# Patient Record
Sex: Female | Born: 1950 | Race: Black or African American | Hispanic: No | Smoking: Never smoker
Health system: Southern US, Community
[De-identification: ages and names within clinical notes are randomized; demographics above are authoritative.]

## PROBLEM LIST (undated history)

## (undated) DIAGNOSIS — D649 Anemia, unspecified: Secondary | ICD-10-CM

## (undated) DIAGNOSIS — E039 Hypothyroidism, unspecified: Secondary | ICD-10-CM

## (undated) DIAGNOSIS — I10 Essential (primary) hypertension: Secondary | ICD-10-CM

## (undated) DIAGNOSIS — D219 Benign neoplasm of connective and other soft tissue, unspecified: Secondary | ICD-10-CM

## (undated) HISTORY — PX: DILATION AND CURETTAGE OF UTERUS: SHX78

## (undated) HISTORY — DX: Benign neoplasm of connective and other soft tissue, unspecified: D21.9

## (undated) HISTORY — PX: HYSTEROSCOPY: SHX211

## (undated) HISTORY — DX: Essential (primary) hypertension: I10

## (undated) HISTORY — DX: Hypothyroidism, unspecified: E03.9

## (undated) HISTORY — DX: Anemia, unspecified: D64.9

---

## 2004-11-09 ENCOUNTER — Ambulatory Visit: Payer: Self-pay | Admitting: Gastroenterology

## 2004-12-23 ENCOUNTER — Ambulatory Visit: Payer: Self-pay

## 2006-01-20 ENCOUNTER — Ambulatory Visit: Payer: Self-pay

## 2007-01-24 ENCOUNTER — Ambulatory Visit: Payer: Self-pay

## 2008-01-31 ENCOUNTER — Ambulatory Visit: Payer: Self-pay

## 2009-02-04 ENCOUNTER — Ambulatory Visit: Payer: Self-pay

## 2010-02-05 ENCOUNTER — Ambulatory Visit: Payer: Self-pay

## 2011-02-15 ENCOUNTER — Ambulatory Visit: Payer: Self-pay

## 2012-03-06 ENCOUNTER — Ambulatory Visit: Payer: Self-pay

## 2013-03-15 ENCOUNTER — Ambulatory Visit: Payer: Self-pay

## 2013-10-24 ENCOUNTER — Ambulatory Visit: Payer: Self-pay | Admitting: Obstetrics and Gynecology

## 2013-10-24 LAB — COMPREHENSIVE METABOLIC PANEL
Anion Gap: 3 — ABNORMAL LOW (ref 7–16)
Bilirubin,Total: 0.4 mg/dL (ref 0.2–1.0)
Chloride: 105 mmol/L (ref 98–107)
EGFR (Non-African Amer.): >60
Glucose: 85 mg/dL (ref 65–99)
Osmolality: 277 (ref 275–301)
Potassium: 3.7 mmol/L (ref 3.5–5.1)
SGOT(AST): 25 U/L (ref 15–37)

## 2013-10-24 LAB — CBC
HGB: 12 g/dL (ref 12.0–16.0)
MCH: 32.6 pg (ref 26.0–34.0)
MCHC: 34.2 g/dL (ref 32.0–36.0)
MCV: 95 fL (ref 80–100)
Platelet: 261 10*3/uL (ref 150–440)

## 2013-10-30 ENCOUNTER — Ambulatory Visit: Payer: Self-pay | Admitting: Obstetrics and Gynecology

## 2013-11-01 LAB — PATHOLOGY REPORT

## 2014-04-10 ENCOUNTER — Ambulatory Visit: Payer: Self-pay

## 2014-09-01 DIAGNOSIS — E039 Hypothyroidism, unspecified: Secondary | ICD-10-CM | POA: Insufficient documentation

## 2014-09-01 DIAGNOSIS — E7800 Pure hypercholesterolemia, unspecified: Secondary | ICD-10-CM | POA: Insufficient documentation

## 2014-09-01 DIAGNOSIS — I10 Essential (primary) hypertension: Secondary | ICD-10-CM | POA: Insufficient documentation

## 2015-03-21 NOTE — Op Note (Signed)
PATIENT NAME:  Bianca Arnold, Bianca Arnold MR#:  [id number] DATE OF BIRTH:  [birth year]  DATE OF PROCEDURE:  10/30/2013  PREOPERATIVE DIAGNOSES:  1.  Menopause.  2.  Significantly thickened endometrial stripe.  3.  Cervical stenosis, unable to sample endometrium in office.  POSTOPERATIVE DIAGNOSES: 1.  Menopause.  2.  Significantly thickened endometrial stripe.  3.  Cervical stenosis, unable to sample endometrium in office.   PROCEDURES:  1.  Dilation and curettage.  2.  Hysteroscopy.   ANESTHESIA: General.   SURGEON: Prentice Docker, M.D.   ESTIMATED BLOOD LOSS: 20 mL.  OPERATIVE FLUIDS: 500 mL crystalloid.   COMPLICATIONS: None.   FINDINGS: Polypoid lesions in uterus.   SPECIMENS: Endometrial curettings.   CONDITION AT END OF PROCEDURE: Stable.   PROCEDURE IN DETAIL: The patient was taken to the operating room where general anesthesia was administered and found to be adequate. She was placed in the dorsal supine high lithotomy position in candy cane stirrups and prepped and draped in the usual sterile fashion. After a timeout was called, a red rubber catheter was used to do an in and out catheterization for return of 100 mL of clear urine. A sterile speculum was placed in the vagina and a single-tooth tenaculum was used to grasp the anterior lip of the cervix. The uterus was sounded to a depth of approximately 8 cm and found to be retroverted. The cervix was then dilated gently in a serial fashion using Hegar dilators to a dilatation approximately 8 mm. The hysteroscope was then gently introduced through the cervix with the above-noted findings appreciated upon entry. The hysteroscope was removed and a gentle curettage was undertaken to get a global sampling of the uterus. The hysteroscope was then reintroduced to verify global sampling as well as no damage done to the uterus through the curetting process. The hysteroscope was then removed as well as the single-tooth tenaculum. The tenaculum  site was found to be hemostatic after application of silver nitrate. All instrumentation was removed from the vagina. This concluded the surgery.   The patient tolerated the procedure well. Sponge, lap, and needle counts were correct x 2. She was wearing pneumatic compression stockings for VTE prophylaxis throughout the entire procedure. She was awakened in the operating room and taken to the recovery area in stable condition.    ____________________________ Will Bonnet, MD sdj:aw D: 10/30/2013 10:13:16 ET T: 10/30/2013 10:28:13 ET JOB#: [id number]  cc: Will Bonnet, MD, <Dictator> Will Bonnet MD ELECTRONICALLY SIGNED 10/30/2013 19:10

## 2015-03-26 ENCOUNTER — Other Ambulatory Visit: Payer: Self-pay

## 2015-03-26 DIAGNOSIS — Z1231 Encounter for screening mammogram for malignant neoplasm of breast: Secondary | ICD-10-CM

## 2015-04-14 ENCOUNTER — Ambulatory Visit
Admission: RE | Admit: 2015-04-14 | Discharge: 2015-04-14 | Disposition: A | Discharge status: Home / Self Care | Payer: 59 | Source: Ambulatory Visit | Attending: Unknown Physician Specialty | Admitting: Unknown Physician Specialty

## 2015-04-14 DIAGNOSIS — Z1231 Encounter for screening mammogram for malignant neoplasm of breast: Secondary | ICD-10-CM | POA: Diagnosis not present

## 2016-04-12 ENCOUNTER — Other Ambulatory Visit: Payer: Self-pay | Admitting: Unknown Physician Specialty

## 2016-04-12 DIAGNOSIS — Z1231 Encounter for screening mammogram for malignant neoplasm of breast: Secondary | ICD-10-CM

## 2016-04-13 LAB — HM PAP SMEAR: HM PAP: NEGATIVE

## 2016-04-22 ENCOUNTER — Other Ambulatory Visit: Payer: Self-pay | Admitting: Unknown Physician Specialty

## 2016-04-22 ENCOUNTER — Ambulatory Visit
Admission: RE | Admit: 2016-04-22 | Discharge: 2016-04-22 | Disposition: A | Discharge status: Home / Self Care | Payer: 59 | Source: Ambulatory Visit | Attending: Unknown Physician Specialty | Admitting: Unknown Physician Specialty

## 2016-04-22 DIAGNOSIS — Z1231 Encounter for screening mammogram for malignant neoplasm of breast: Secondary | ICD-10-CM | POA: Diagnosis present

## 2017-04-21 ENCOUNTER — Encounter: Payer: Self-pay | Admitting: Obstetrics and Gynecology

## 2017-04-21 ENCOUNTER — Ambulatory Visit (INDEPENDENT_AMBULATORY_CARE_PROVIDER_SITE_OTHER): Payer: 59 | Admitting: Obstetrics and Gynecology

## 2017-04-21 VITALS — BP 124/70 | Ht 67.0 in | Wt 141.0 lb

## 2017-04-21 DIAGNOSIS — Z1339 Encounter for screening examination for other mental health and behavioral disorders: Secondary | ICD-10-CM

## 2017-04-21 DIAGNOSIS — Z01419 Encounter for gynecological examination (general) (routine) without abnormal findings: Secondary | ICD-10-CM | POA: Insufficient documentation

## 2017-04-21 DIAGNOSIS — Z124 Encounter for screening for malignant neoplasm of cervix: Secondary | ICD-10-CM

## 2017-04-21 DIAGNOSIS — E2839 Other primary ovarian failure: Secondary | ICD-10-CM

## 2017-04-21 DIAGNOSIS — Z1231 Encounter for screening mammogram for malignant neoplasm of breast: Secondary | ICD-10-CM

## 2017-04-21 DIAGNOSIS — Z1389 Encounter for screening for other disorder: Secondary | ICD-10-CM

## 2017-04-21 DIAGNOSIS — Z13820 Encounter for screening for osteoporosis: Secondary | ICD-10-CM

## 2017-04-21 DIAGNOSIS — Z9189 Other specified personal risk factors, not elsewhere classified: Secondary | ICD-10-CM

## 2017-04-21 DIAGNOSIS — Z1239 Encounter for other screening for malignant neoplasm of breast: Secondary | ICD-10-CM

## 2017-04-21 DIAGNOSIS — Z1331 Encounter for screening for depression: Secondary | ICD-10-CM

## 2017-04-21 NOTE — Progress Notes (Signed)
Routine Annual Gynecology Examination   PCP: Bianca Ruths, Bianca Arnold  Chief Complaint  Patient presents with  . Annual Exam    History of Present Illness: Patient is a 66 y.o. G0P0000 presents for annual exam. The patient has no complaints today.   Menopausal bleeding: denies  Menopausal symptoms: denies  Breast symptoms: denies  Last pap smear: 1 years ago.  Result Normal  Last mammogram: 1 years ago.  Result Normal  Past Medical History:  Diagnosis Date  . Anemia   . Fibroids   . Hypertension   . Hypothyroid     Past Surgical History:  Procedure Laterality Date  . DILATION AND CURETTAGE OF UTERUS    . HYSTEROSCOPY      Medications:   Medication Sig Start Date End Date Taking? Authorizing Provider  levothyroxine (SYNTHROID, LEVOTHROID) 75 MCG tablet TAKE 1 TABLET BY MOUTH  DAILY 11/03/16  Yes Provider, Historical, Bianca Arnold  lisinopril (PRINIVIL,ZESTRIL) 10 MG tablet TAKE 1 TABLET BY MOUTH  DAILY 11/03/16  Yes Provider, Historical, Bianca Arnold    Allergies  Allergen Reactions  . Penicillin V Potassium Other (See Comments)    Severe reaction    Gynecologic History:  No LMP recorded. Patient is postmenopausal. Contraception: n/a, menopause  Obstetric History: G0P0000  Social History   Social History  . Marital status: Widowed    Spouse name: N/A  . Number of children: N/A  . Years of education: N/A   Occupational History  . Not on file.   Social History Main Topics  . Smoking status: Never Smoker  . Smokeless tobacco: Never Used  . Alcohol use No  . Drug use: No  . Sexual activity: Not Currently    Birth control/ protection: Post-menopausal   Other Topics Concern  . Not on file   Social History Narrative  . No narrative on file    Family History  Problem Relation Age of Onset  . Hypertension Mother   . Breast cancer Neg Hx     Review of Systems  Constitutional: Negative.   HENT: Negative.   Eyes: Negative.   Respiratory: Negative.     Cardiovascular: Negative.   Gastrointestinal: Negative.   Genitourinary: Negative.   Musculoskeletal: Negative.   Skin: Negative.   Neurological: Negative.   Psychiatric/Behavioral: Negative.      Physical Exam Vitals: BP 124/70   Ht 5\' 7"  (1.702 m)   Wt 141 lb (64 kg)   BMI 22.08 kg/m   Physical Exam  Constitutional: She is oriented to person, place, and time and well-developed, well-nourished, and in no distress. No distress.  HENT:  Head: Normocephalic and atraumatic.  Eyes: Conjunctivae are normal. Left eye exhibits no discharge. No scleral icterus.  Neck: Normal range of motion. Neck supple.  Cardiovascular: Normal rate and regular rhythm.  Exam reveals no gallop and no friction rub.   No murmur heard. Pulmonary/Chest: Effort normal and breath sounds normal. No respiratory distress. She has no wheezes. She has no rales. Right breast exhibits no inverted nipple, no mass, no nipple discharge, no skin change and no tenderness. Left breast exhibits no inverted nipple, no mass, no nipple discharge, no skin change and no tenderness. Breasts are symmetrical.  Abdominal: Soft. Bowel sounds are normal. She exhibits no distension and no mass. There is no tenderness. There is no rebound and no guarding.  Genitourinary: Vagina normal, uterus normal, cervix normal, right adnexa normal, left adnexa normal and vulva normal.  Musculoskeletal: Normal range of motion. She exhibits no  edema.  Lymphadenopathy:    She has no cervical adenopathy.  Neurological: She is alert and oriented to person, place, and time. No cranial nerve deficit.  Skin: Skin is warm and dry. No rash noted.  Psychiatric: Mood, affect and judgment normal.    Female chaperone present for pelvic and breast  portions of the physical exam  Results: AUDIT Questionnaire (screen for alcoholism): 0 PHQ-9: 0   Assessment and Plan:  66 y.o. G0P0000 here for annual gynecologic examination.   Plan: Problem List Items  Addressed This Visit    Women's annual routine gynecological examination - Primary   Relevant Orders   MM DIGITAL SCREENING BILATERAL   Pap IG (Image Guided)    Other Visit Diagnoses    Screening for depression       Screening for alcohol problem       Pap smear for cervical cancer screening       Relevant Orders   Pap IG (Image Guided)   Screening for breast cancer       Relevant Orders   MM DIGITAL SCREENING BILATERAL   Screening for osteoporosis         Screening: -- Blood pressure: per PCP -- Colonoscopy - not due -- Mammogram - due. Ordered. Patient understands to call [facility] to schedule.  -- Weight screening: normal -- Depression screening negative (PHQ-9) -- Nutrition: normal -- cholesterol screening: n/a -- osteoporosis screening: Needs DEXA. Attempting to order.  -- tobacco screening: not using -- alcohol screening: AUDIT questionnaire indicates low-risk usage. -- family history of breast cancer screening: done. not at high risk. -- no evidence of domestic violence or intimate partner violence. -- STD screening: gonorrhea/chlamydia NAAT not collected. -- pap smear collected.  Prentice Docker, Bianca Arnold 04/21/2017 2:44 PM

## 2017-04-26 LAB — PAP IG (IMAGE GUIDED): PAP Smear Comment: 0

## 2017-04-28 ENCOUNTER — Encounter: Payer: Self-pay | Admitting: Obstetrics and Gynecology

## 2017-05-05 NOTE — Addendum Note (Signed)
Addended by: Prentice Docker D on: 05/05/2017 04:44 PM   Modules accepted: Orders

## 2017-05-09 ENCOUNTER — Telehealth: Payer: Self-pay | Admitting: Obstetrics and Gynecology

## 2017-05-09 NOTE — Telephone Encounter (Signed)
Patient is aware of DEXA scan at [facility] on Monday, 06/27/17 @ 1pm and to avoid wearing metal on her clothing.

## 2017-05-26 ENCOUNTER — Ambulatory Visit
Admission: RE | Admit: 2017-05-26 | Discharge: 2017-05-26 | Disposition: A | Discharge status: Home / Self Care | Payer: 59 | Source: Ambulatory Visit | Attending: Obstetrics and Gynecology | Admitting: Obstetrics and Gynecology

## 2017-05-26 DIAGNOSIS — Z1231 Encounter for screening mammogram for malignant neoplasm of breast: Secondary | ICD-10-CM | POA: Insufficient documentation

## 2017-05-26 DIAGNOSIS — Z01419 Encounter for gynecological examination (general) (routine) without abnormal findings: Secondary | ICD-10-CM

## 2017-05-26 DIAGNOSIS — Z1239 Encounter for other screening for malignant neoplasm of breast: Secondary | ICD-10-CM

## 2017-06-27 ENCOUNTER — Ambulatory Visit
Admission: RE | Admit: 2017-06-27 | Discharge: 2017-06-27 | Disposition: A | Discharge status: Home / Self Care | Payer: 59 | Source: Ambulatory Visit | Attending: Obstetrics and Gynecology | Admitting: Obstetrics and Gynecology

## 2017-06-27 DIAGNOSIS — Z9189 Other specified personal risk factors, not elsewhere classified: Secondary | ICD-10-CM | POA: Insufficient documentation

## 2017-06-27 DIAGNOSIS — M85852 Other specified disorders of bone density and structure, left thigh: Secondary | ICD-10-CM | POA: Insufficient documentation

## 2017-06-27 DIAGNOSIS — E2839 Other primary ovarian failure: Secondary | ICD-10-CM | POA: Diagnosis present

## 2017-08-17 ENCOUNTER — Encounter: Payer: Self-pay | Admitting: Obstetrics and Gynecology

## 2017-10-06 DIAGNOSIS — M85852 Other specified disorders of bone density and structure, left thigh: Secondary | ICD-10-CM | POA: Insufficient documentation

## 2018-05-11 ENCOUNTER — Other Ambulatory Visit: Payer: Self-pay | Admitting: Obstetrics and Gynecology

## 2018-05-11 DIAGNOSIS — Z1231 Encounter for screening mammogram for malignant neoplasm of breast: Secondary | ICD-10-CM

## 2018-05-17 ENCOUNTER — Ambulatory Visit (INDEPENDENT_AMBULATORY_CARE_PROVIDER_SITE_OTHER): Payer: Medicare HMO | Admitting: Obstetrics and Gynecology

## 2018-05-17 ENCOUNTER — Encounter: Payer: Self-pay | Admitting: Obstetrics and Gynecology

## 2018-05-17 VITALS — BP 118/78 | HR 81 | Ht 67.0 in | Wt 143.0 lb

## 2018-05-17 DIAGNOSIS — Z01419 Encounter for gynecological examination (general) (routine) without abnormal findings: Secondary | ICD-10-CM

## 2018-05-17 DIAGNOSIS — Z1339 Encounter for screening examination for other mental health and behavioral disorders: Secondary | ICD-10-CM

## 2018-05-17 DIAGNOSIS — Z1331 Encounter for screening for depression: Secondary | ICD-10-CM | POA: Diagnosis not present

## 2018-05-17 DIAGNOSIS — M85852 Other specified disorders of bone density and structure, left thigh: Secondary | ICD-10-CM

## 2018-05-17 DIAGNOSIS — Z01411 Encounter for gynecological examination (general) (routine) with abnormal findings: Secondary | ICD-10-CM | POA: Diagnosis not present

## 2018-05-17 NOTE — Progress Notes (Signed)
Routine Annual Gynecology Examination   PCP: Kirk Ruths, MD  Chief Complaint  Patient presents with  . Gynecologic Exam    History of Present Illness: Patient is a 67 y.o. G0P0000 presents for annual exam. The patient has no complaints today.   Menopausal bleeding: denies  Menopausal symptoms: denies  Breast symptoms: denies  Last pap smear: 2 years ago.  Result Normal  Last mammogram: 1 years ago.  Result Normal  Past Medical History:  Diagnosis Date  . Anemia   . Fibroids   . Hypertension   . Hypothyroid    Past Surgical History:  Procedure Laterality Date  . DILATION AND CURETTAGE OF UTERUS    . HYSTEROSCOPY      Prior to Admission medications   Medication Sig Start Date End Date Taking? Authorizing Provider  Ascorbic Acid (VITAMIN C PO) Take by mouth.   Yes [provider]  CALCIUM PO Take by mouth.   Yes [provider]  cholecalciferol (VITAMIN D) 1000 units tablet Take 1,000 Units by mouth daily.   Yes [provider]  levothyroxine (SYNTHROID, LEVOTHROID) 75 MCG tablet TAKE 1 TABLET BY MOUTH  DAILY 11/03/16  Yes [provider]  lisinopril (PRINIVIL,ZESTRIL) 10 MG tablet TAKE 1 TABLET BY MOUTH  DAILY 11/03/16  Yes [provider]  MAGNESIUM PO Take by mouth.   Yes [provider]    Allergies  Allergen Reactions  . Penicillin V Potassium Other (See Comments)    Severe reaction   Obstetric History: G0P0000  Social History   Socioeconomic History  . Marital status: Widowed    Spouse name: Not on file  . Number of children: Not on file  . Years of education: Not on file  . Highest education level: Not on file  Occupational History  . Not on file  Social Needs  . Financial resource strain: Not on file  . Food insecurity:    Worry: Not on file    Inability: Not on file  . Transportation needs:    Medical: Not on file    Non-medical: Not on file  Tobacco Use  . Smoking status:  Never Smoker  . Smokeless tobacco: Never Used  Substance and Sexual Activity  . Alcohol use: No  . Drug use: No  . Sexual activity: Not Currently    Birth control/protection: Post-menopausal  Lifestyle  . Physical activity:    Days per week: Not on file    Minutes per session: Not on file  . Stress: Not on file  Relationships  . Social connections:    Talks on phone: Not on file    Gets together: Not on file    Attends religious service: Not on file    Active member of club or organization: Not on file    Attends meetings of clubs or organizations: Not on file    Relationship status: Not on file  . Intimate partner violence:    Fear of current or ex partner: Not on file    Emotionally abused: Not on file    Physically abused: Not on file    Forced sexual activity: Not on file  Other Topics Concern  . Not on file  Social History Narrative  . Not on file    Family History  Problem Relation Age of Onset  . Hypertension Mother   . Cancer Brother   . Breast cancer Neg Hx     Review of Systems  Constitutional: Negative.   HENT:  Negative.   Eyes: Negative.   Respiratory: Negative.   Cardiovascular: Negative.   Gastrointestinal: Negative.   Genitourinary: Negative.   Musculoskeletal: Negative.   Skin: Negative.   Neurological: Negative.   Psychiatric/Behavioral: Negative.     Physical Exam Vitals: BP 118/78 (BP Location: Left Arm, Patient Position: Sitting, Cuff Size: Normal)   Pulse 81   Ht 5\' 7"  (1.702 m)   Wt 143 lb (64.9 kg)   SpO2 99%   BMI 22.40 kg/m   Physical Exam  Constitutional: She is oriented to person, place, and time. She appears well-developed and well-nourished. No distress.  Genitourinary: Uterus normal. Pelvic exam was performed with patient supine. There is no rash, tenderness, lesion or injury on the right labia. There is no rash, tenderness, lesion or injury on the left labia. No erythema, tenderness or bleeding in the vagina. No signs of  injury around the vagina. No vaginal discharge found. Right adnexum does not display mass, does not display tenderness and does not display fullness. Left adnexum does not display mass, does not display tenderness and does not display fullness. Cervix does not exhibit motion tenderness, lesion, discharge or polyp.   Uterus is mobile and anteverted. Uterus is not enlarged, tender or exhibiting a mass.  HENT:  Head: Normocephalic and atraumatic.  Eyes: EOM are normal. No scleral icterus.  Neck: Normal range of motion. Neck supple. No thyromegaly present.  Cardiovascular: Normal rate and regular rhythm. Exam reveals no gallop and no friction rub.  No murmur heard. Pulmonary/Chest: Effort normal and breath sounds normal. No respiratory distress. She has no wheezes. She has no rales. Right breast exhibits no inverted nipple, no mass, no nipple discharge, no skin change and no tenderness. Left breast exhibits no inverted nipple, no mass, no nipple discharge, no skin change and no tenderness.  Abdominal: Soft. Bowel sounds are normal. She exhibits no distension and no mass. There is no tenderness. There is no rebound and no guarding.  Musculoskeletal: Normal range of motion. She exhibits no edema or tenderness.  Lymphadenopathy:    She has no cervical adenopathy.       Right: No inguinal adenopathy present.       Left: No inguinal adenopathy present.  Neurological: She is alert and oriented to person, place, and time. No cranial nerve deficit.  Skin: Skin is warm and dry. No rash noted. No erythema.  Psychiatric: She has a normal mood and affect. Her behavior is normal. Judgment normal.     Female chaperone present for pelvic and breast  portions of the physical exam  Results: AUDIT Questionnaire (screen for alcoholism): 1 PHQ-9: 0   Assessment and Plan:  67 y.o. G0P0000 female here for routine annual gynecologic examination  Plan: Problem List Items Addressed This Visit       Musculoskeletal and Integument   Osteopenia of left hip     Other   Women's annual routine gynecological examination - Primary    Other Visit Diagnoses    Screening for depression       Screening for alcoholism          Screening: -- Blood pressure screen managed by PCP -- Colonoscopy - per PCP -- Mammogram - due - already scheduled at [facility] on 7/1//19 -- Weight screening: normal -- Depression screening negative (PHQ-9) -- Nutrition: normal -- cholesterol screening: per PCP -- osteoporosis screening: Osteopenia. Reviewed results from last year.   Discussed continued taking Vitamin D/Calcium. Repeat DEXA next year. -- tobacco screening: not using --  alcohol screening: AUDIT questionnaire indicates low-risk usage. -- family history of breast cancer screening: done. not at high risk. -- no evidence of domestic violence or intimate partner violence. -- STD screening: gonorrhea/chlamydia NAAT not collected per patient request. -- pap smear not collected per ASCCP guidelines -- HPV vaccination series: not eligilbe   Prentice Docker, MD 05/17/2018 5:26 PM

## 2018-05-29 ENCOUNTER — Ambulatory Visit
Admission: RE | Admit: 2018-05-29 | Discharge: 2018-05-29 | Disposition: A | Discharge status: Home / Self Care | Payer: Medicare HMO | Source: Ambulatory Visit | Attending: Obstetrics and Gynecology | Admitting: Obstetrics and Gynecology

## 2018-05-29 DIAGNOSIS — Z1231 Encounter for screening mammogram for malignant neoplasm of breast: Secondary | ICD-10-CM | POA: Diagnosis present

## 2019-04-30 ENCOUNTER — Other Ambulatory Visit: Payer: Self-pay | Admitting: Obstetrics and Gynecology

## 2019-04-30 DIAGNOSIS — Z1231 Encounter for screening mammogram for malignant neoplasm of breast: Secondary | ICD-10-CM

## 2019-05-31 ENCOUNTER — Ambulatory Visit (INDEPENDENT_AMBULATORY_CARE_PROVIDER_SITE_OTHER): Payer: Medicare HMO | Admitting: Obstetrics and Gynecology

## 2019-05-31 ENCOUNTER — Encounter: Payer: Self-pay | Admitting: Obstetrics and Gynecology

## 2019-05-31 ENCOUNTER — Telehealth: Payer: Self-pay | Admitting: Obstetrics and Gynecology

## 2019-05-31 ENCOUNTER — Other Ambulatory Visit: Payer: Self-pay

## 2019-05-31 VITALS — BP 122/74 | Ht 67.0 in | Wt 135.0 lb

## 2019-05-31 DIAGNOSIS — E2839 Other primary ovarian failure: Secondary | ICD-10-CM

## 2019-05-31 DIAGNOSIS — Z1331 Encounter for screening for depression: Secondary | ICD-10-CM

## 2019-05-31 DIAGNOSIS — Z124 Encounter for screening for malignant neoplasm of cervix: Secondary | ICD-10-CM | POA: Diagnosis not present

## 2019-05-31 DIAGNOSIS — Z1339 Encounter for screening examination for other mental health and behavioral disorders: Secondary | ICD-10-CM | POA: Diagnosis not present

## 2019-05-31 DIAGNOSIS — M85852 Other specified disorders of bone density and structure, left thigh: Secondary | ICD-10-CM | POA: Diagnosis not present

## 2019-05-31 NOTE — Telephone Encounter (Signed)
Pt aware of Dexa scan on 07/11/2019 at 2:20pm. No calcium supplements the morning of.

## 2019-05-31 NOTE — Progress Notes (Addendum)
Routine Annual Gynecology Examination   PCP: Kirk Ruths, MD  Chief Complaint  Patient presents with  . Follow-up    Osteopenia    History of Present Illness: Patient is a 68 y.o. G0P0000 presents for a follow up exam for osteopenia. The patient has no complaints today.   Menopausal bleeding: denies  Menopausal symptoms: denies  Breast symptoms: denies  Last pap smear: 2 years ago.  Result Normal  Last mammogram: 1 years ago.  Result Normal   Last Colonoscopy: end of 2019.  Small polyp and internal hemorrhoid. Follow up 10 years.   Last DEXA 2 years ago. Osteopenia. T score -2.0, FRAX Major fractore 3.6% of 10 years, hip 0.4% within next 10 years.   Past Medical History:  Diagnosis Date  . Anemia   . Fibroids   . Hypertension   . Hypothyroid     Past Surgical History:  Procedure Laterality Date  . DILATION AND CURETTAGE OF UTERUS    . HYSTEROSCOPY      Prior to Admission medications   Medication Sig Start Date End Date Taking? Authorizing Provider  Ascorbic Acid (VITAMIN C PO) Take by mouth.   Yes [provider]  CALCIUM PO Take by mouth.   Yes [provider]  cholecalciferol (VITAMIN D) 1000 units tablet Take 1,000 Units by mouth daily.   Yes [provider]  levothyroxine (SYNTHROID, LEVOTHROID) 75 MCG tablet TAKE 1 TABLET BY MOUTH  DAILY 11/03/16  Yes [provider]  lisinopril (PRINIVIL,ZESTRIL) 10 MG tablet TAKE 1 TABLET BY MOUTH  DAILY 11/03/16  Yes [provider]  MAGNESIUM PO Take by mouth.   Yes [provider]    Allergies  Allergen Reactions  . Penicillin V Potassium Other (See Comments)    Severe reaction    Obstetric History: G0P0000  Social History   Socioeconomic History  . Marital status: Widowed    Spouse name: Not on file  . Number of children: Not on file  . Years of education: Not on file  . Highest education level: Not on file  Occupational History  . Not on  file  Social Needs  . Financial resource strain: Not on file  . Food insecurity    Worry: Not on file    Inability: Not on file  . Transportation needs    Medical: Not on file    Non-medical: Not on file  Tobacco Use  . Smoking status: Never Smoker  . Smokeless tobacco: Never Used  Substance and Sexual Activity  . Alcohol use: No  . Drug use: No  . Sexual activity: Not Currently    Birth control/protection: Post-menopausal  Lifestyle  . Physical activity    Days per week: Not on file    Minutes per session: Not on file  . Stress: Not on file  Relationships  . Social Herbalist on phone: Not on file    Gets together: Not on file    Attends religious service: Not on file    Active member of club or organization: Not on file    Attends meetings of clubs or organizations: Not on file    Relationship status: Not on file  . Intimate partner violence    Fear of current or ex partner: Not on file    Emotionally abused: Not on file    Physically abused: Not on file    Forced sexual activity: Not on file  Other Topics Concern  . Not  on file  Social History Narrative  . Not on file    Family History  Problem Relation Age of Onset  . Hypertension Mother   . Cancer Brother   . Breast cancer Neg Hx     Review of Systems  Constitutional: Negative.   HENT: Negative.   Eyes: Negative.   Respiratory: Negative.   Cardiovascular: Negative.   Gastrointestinal: Negative.   Genitourinary: Negative.   Musculoskeletal: Negative.   Skin: Negative.   Neurological: Negative.   Psychiatric/Behavioral: Negative.      Physical Exam Vitals: BP 122/74   Ht 5\' 7"  (1.702 m)   Wt 135 lb (61.2 kg)   BMI 21.14 kg/m   Physical Exam Constitutional:      General: She is not in acute distress.    Appearance: Normal appearance. She is well-developed.  Genitourinary:     Pelvic exam was performed with patient supine.     Vulva, urethra, bladder and uterus normal.     No  inguinal adenopathy present in the right or left side.    No signs of injury in the vagina.     No vaginal discharge, erythema, tenderness or bleeding.     No cervical motion tenderness, discharge, lesion or polyp.     Uterus is mobile.     Uterus is not enlarged or tender.     No uterine mass detected.    Uterus is anteverted.     No right or left adnexal mass present.     Right adnexa not tender or full.     Left adnexa not tender or full.  HENT:     Head: Normocephalic and atraumatic.  Eyes:     General: No scleral icterus.    Conjunctiva/sclera: Conjunctivae normal.  Neck:     Musculoskeletal: Normal range of motion and neck supple.     Thyroid: No thyromegaly.  Cardiovascular:     Rate and Rhythm: Normal rate and regular rhythm.     Heart sounds: No murmur. No friction rub. No gallop.   Pulmonary:     Effort: Pulmonary effort is normal. No respiratory distress.     Breath sounds: Normal breath sounds. No wheezing or rales.  Chest:     Breasts:        Right: No inverted nipple, mass, nipple discharge, skin change or tenderness.        Left: No inverted nipple, mass, nipple discharge, skin change or tenderness.  Abdominal:     General: Bowel sounds are normal. There is no distension.     Palpations: Abdomen is soft. There is no mass.     Tenderness: There is no abdominal tenderness. There is no guarding or rebound.  Musculoskeletal: Normal range of motion.        General: No swelling or tenderness.  Lymphadenopathy:     Cervical: No cervical adenopathy.     Lower Body: No right inguinal adenopathy. No left inguinal adenopathy.  Neurological:     General: No focal deficit present.     Mental Status: She is alert and oriented to person, place, and time.     Cranial Nerves: No cranial nerve deficit.  Skin:    General: Skin is warm and dry.     Findings: No erythema or rash.  Psychiatric:        Mood and Affect: Mood normal.        Behavior: Behavior normal.         Judgment: Judgment normal.  Female chaperone present for pelvic and breast  portions of the physical exam  Results: AUDIT Questionnaire (screen for alcoholism): 2 PHQ-9: 0  Assessment and Plan:  68 y.o. G0P0000 female here for follow up of osteopenia.    Plan: Problem List Items Addressed This Visit      Musculoskeletal and Integument   Osteopenia of left hip - Primary   Relevant Orders   DG Bone Density    Other Visit Diagnoses    Screening for depression       Screening for alcoholism       Estrogen deficiency       Relevant Orders   DG Bone Density      Screening: -- Blood pressure screen managed by PCP -- Colonoscopy - per PCP -- Mammogram - due - already scheduled at [facility] on 06/13/2019 -- Weight screening: normal -- Depression screening negative (PHQ-9) -- Nutrition: normal -- cholesterol screening: per PCP -- osteoporosis screening: Osteopenia 2 years ago.  Due for follow up -- tobacco screening: not using -- alcohol screening: AUDIT questionnaire indicates low-risk usage. -- family history of breast cancer screening: done. not at high risk. -- no evidence of domestic violence or intimate partner violence. -- STD screening: gonorrhea/chlamydia NAAT not collected per patient request. -- pap smear not collected per ASCCP guidelines -- HPV vaccination series: not eligilbe  Osteopenia: Given that her last DEXA was 2 years ago, she is due again. Will repeat DEXA and FRAX to see her risk and possible need for treatment.   Prentice Docker, MD 05/31/2019 11:47 AM

## 2019-06-13 ENCOUNTER — Other Ambulatory Visit: Payer: Self-pay

## 2019-06-13 ENCOUNTER — Ambulatory Visit
Admission: RE | Admit: 2019-06-13 | Discharge: 2019-06-13 | Disposition: A | Discharge status: Home / Self Care | Payer: Medicare HMO | Source: Ambulatory Visit | Attending: Obstetrics and Gynecology | Admitting: Obstetrics and Gynecology

## 2019-06-13 DIAGNOSIS — Z1231 Encounter for screening mammogram for malignant neoplasm of breast: Secondary | ICD-10-CM | POA: Diagnosis present

## 2019-07-03 NOTE — Addendum Note (Signed)
Addended by: Prentice Docker D on: 07/03/2019 12:32 PM   Modules accepted: Orders, Level of Service

## 2019-07-11 ENCOUNTER — Other Ambulatory Visit: Payer: Self-pay

## 2019-07-11 ENCOUNTER — Ambulatory Visit
Admission: RE | Admit: 2019-07-11 | Discharge: 2019-07-11 | Disposition: A | Discharge status: Home / Self Care | Payer: Medicare HMO | Source: Ambulatory Visit | Attending: Obstetrics and Gynecology | Admitting: Obstetrics and Gynecology

## 2019-07-11 DIAGNOSIS — M85852 Other specified disorders of bone density and structure, left thigh: Secondary | ICD-10-CM | POA: Diagnosis present

## 2019-07-11 DIAGNOSIS — E2839 Other primary ovarian failure: Secondary | ICD-10-CM | POA: Insufficient documentation

## 2019-09-21 ENCOUNTER — Encounter: Payer: Self-pay | Admitting: Obstetrics and Gynecology

## 2020-05-05 ENCOUNTER — Other Ambulatory Visit: Payer: Self-pay | Admitting: Internal Medicine

## 2020-05-05 DIAGNOSIS — Z1231 Encounter for screening mammogram for malignant neoplasm of breast: Secondary | ICD-10-CM

## 2020-06-13 ENCOUNTER — Ambulatory Visit
Admission: RE | Admit: 2020-06-13 | Discharge: 2020-06-13 | Disposition: A | Discharge status: Home / Self Care | Payer: Medicare HMO | Source: Ambulatory Visit | Attending: Internal Medicine | Admitting: Internal Medicine

## 2020-06-13 DIAGNOSIS — Z1231 Encounter for screening mammogram for malignant neoplasm of breast: Secondary | ICD-10-CM

## 2020-06-20 ENCOUNTER — Other Ambulatory Visit: Payer: Self-pay

## 2020-06-20 ENCOUNTER — Encounter: Payer: Self-pay | Admitting: Obstetrics and Gynecology

## 2020-06-20 ENCOUNTER — Ambulatory Visit (INDEPENDENT_AMBULATORY_CARE_PROVIDER_SITE_OTHER): Payer: Medicare HMO | Admitting: Obstetrics and Gynecology

## 2020-06-20 VITALS — BP 122/76 | Ht 66.0 in | Wt 119.0 lb

## 2020-06-20 DIAGNOSIS — Z1339 Encounter for screening examination for other mental health and behavioral disorders: Secondary | ICD-10-CM

## 2020-06-20 DIAGNOSIS — Z01419 Encounter for gynecological examination (general) (routine) without abnormal findings: Secondary | ICD-10-CM

## 2020-06-20 DIAGNOSIS — Z1331 Encounter for screening for depression: Secondary | ICD-10-CM | POA: Diagnosis not present

## 2020-06-20 NOTE — Progress Notes (Signed)
Routine Annual Gynecology Examination   PCP: Kirk Ruths, MD  Chief Complaint  Patient presents with  . Annual Exam   History of Present Illness: Patient is a 69 y.o. G0P0000 presents for annual exam. The patient has no complaints today.   Menopausal bleeding: denies  Menopausal symptoms: denies  Breast symptoms: denies  Last pap smear: 3 years ago.  Result Normal  Last mammogram: 05/2020 years ago.  Result Normal  Past Medical History:  Diagnosis Date  . Anemia   . Fibroids   . Hypertension   . Hypothyroid     Past Surgical History:  Procedure Laterality Date  . DILATION AND CURETTAGE OF UTERUS    . HYSTEROSCOPY      Prior to Admission medications   Medication Sig Start Date End Date Taking? Authorizing Provider  Ascorbic Acid (VITAMIN C PO) Take by mouth.   Yes [provider]  CALCIUM PO Take by mouth.   Yes [provider]  cholecalciferol (VITAMIN D) 1000 units tablet Take 1,000 Units by mouth daily.   Yes [provider]  fluticasone [facility] ALLERGY RELIEF) 50 MCG/ACT nasal spray  09/17/19  Yes [provider]  levothyroxine (SYNTHROID, LEVOTHROID) 75 MCG tablet TAKE 1 TABLET BY MOUTH  DAILY 11/03/16  Yes [provider]  lisinopril (PRINIVIL,ZESTRIL) 10 MG tablet TAKE 1 TABLET BY MOUTH  DAILY 11/03/16  Yes [provider]  MAGNESIUM PO Take by mouth.   Yes [provider]    Allergies  Allergen Reactions  . Penicillin V Potassium Other (See Comments)    Severe reaction    Obstetric History: G0P0000  Social History   Socioeconomic History  . Marital status: Widowed    Spouse name: Not on file  . Number of children: Not on file  . Years of education: Not on file  . Highest education level: Not on file  Occupational History  . Not on file  Tobacco Use  . Smoking status: Never Smoker  . Smokeless tobacco: Never Used  Vaping Use  . Vaping Use: Never used  Substance and  Sexual Activity  . Alcohol use: No  . Drug use: No  . Sexual activity: Not Currently    Birth control/protection: Post-menopausal  Other Topics Concern  . Not on file  Social History Narrative  . Not on file   Social Determinants of Health   Financial Resource Strain:   . Difficulty of Paying Living Expenses:   Food Insecurity:   . Worried About Charity fundraiser in the Last Year:   . Arboriculturist in the Last Year:   Transportation Needs:   . Film/video editor (Medical):   Marland Kitchen Lack of Transportation (Non-Medical):   Physical Activity:   . Days of Exercise per Week:   . Minutes of Exercise per Session:   Stress:   . Feeling of Stress :   Social Connections:   . Frequency of Communication with Friends and Family:   . Frequency of Social Gatherings with Friends and Family:   . Attends Religious Services:   . Active Member of Clubs or Organizations:   . Attends Archivist Meetings:   Marland Kitchen Marital Status:   Intimate Partner Violence:   . Fear of Current or Ex-Partner:   . Emotionally Abused:   Marland Kitchen Physically Abused:   . Sexually Abused:     Family History  Problem Relation Age of Onset  . Hypertension Mother   . Cancer Brother   .  Breast cancer Neg Hx     Review of Systems  Constitutional: Negative.   HENT: Negative.   Eyes: Negative.   Respiratory: Negative.   Cardiovascular: Negative.   Gastrointestinal: Negative.   Genitourinary: Negative.   Musculoskeletal: Negative.   Skin: Negative.   Neurological: Negative.   Psychiatric/Behavioral: Negative.      Physical Exam Vitals: BP 122/76   Ht 5\' 6"  (1.676 m)   Wt 119 lb (54 kg)   BMI 19.21 kg/m   Physical Exam Constitutional:      General: She is not in acute distress.    Appearance: Normal appearance. She is well-developed.  Genitourinary:     Pelvic exam was performed with patient in the lithotomy position.     Vulva, urethra, bladder and uterus normal.     No inguinal adenopathy  present in the right or left side.    No signs of injury in the vagina.     No vaginal discharge, erythema, tenderness or bleeding.     No cervical motion tenderness, discharge, lesion or polyp.     Uterus is mobile.     Uterus is not enlarged or tender.     No uterine mass detected.    Uterus is anteverted.     No right or left adnexal mass present.     Right adnexa not tender or full.     Left adnexa not tender or full.  HENT:     Head: Normocephalic and atraumatic.  Eyes:     General: No scleral icterus.    Conjunctiva/sclera: Conjunctivae normal.  Neck:     Thyroid: No thyromegaly.  Cardiovascular:     Rate and Rhythm: Normal rate and regular rhythm.     Heart sounds: No murmur heard.  No friction rub. No gallop.   Pulmonary:     Effort: Pulmonary effort is normal. No respiratory distress.     Breath sounds: Normal breath sounds. No wheezing or rales.  Chest:     Breasts:        Right: No inverted nipple, mass, nipple discharge, skin change or tenderness.        Left: No inverted nipple, mass, nipple discharge, skin change or tenderness.  Abdominal:     General: Bowel sounds are normal. There is no distension.     Palpations: Abdomen is soft. There is no mass.     Tenderness: There is no abdominal tenderness. There is no guarding or rebound.  Musculoskeletal:        General: No swelling or tenderness. Normal range of motion.     Cervical back: Normal range of motion and neck supple.  Lymphadenopathy:     Cervical: No cervical adenopathy.     Lower Body: No right inguinal adenopathy. No left inguinal adenopathy.  Neurological:     General: No focal deficit present.     Mental Status: She is alert and oriented to person, place, and time.     Cranial Nerves: No cranial nerve deficit.  Skin:    General: Skin is warm and dry.     Findings: No erythema or rash.  Psychiatric:        Mood and Affect: Mood normal.        Behavior: Behavior normal.        Judgment: Judgment  normal.      Female chaperone present for pelvic and breast  portions of the physical exam  Results: AUDIT Questionnaire (screen for alcoholism): 1 PHQ-9: 2  Assessment and Plan:  69 y.o. G0P0000 female here for routine annual gynecologic examination  Plan: Problem List Items Addressed This Visit      Other   Women's annual routine gynecological examination    Other Visit Diagnoses    Screening for depression    -  Primary   Screening for alcoholism          Screening: -- Blood pressure screen managed by PCP -- Colonoscopy - not due -- Mammogram - not due -- Weight screening: normal -- Depression screening negative (PHQ-9) -- Nutrition: normal -- cholesterol screening: per PCP -- osteoporosis screening: not due -- tobacco screening: not using -- alcohol screening: AUDIT questionnaire indicates low-risk usage. -- family history of breast cancer screening: done. not at high risk. -- no evidence of domestic violence or intimate partner violence. -- STD screening: gonorrhea/chlamydia NAAT not collected per patient request. -- pap smear not collected per ASCCP guidelines  Did not get COVID19 vaccine due to risk of anaphylaxix  Prentice Docker, MD 06/20/2020 11:44 AM

## 2021-01-12 IMAGING — MG DIGITAL SCREENING BILATERAL MAMMOGRAM WITH TOMO AND CAD
8 series · 8 of 24 positions shown · non-contrast
Comparison: Previous exam(s).

CLINICAL DATA: Screening.

EXAM:
DIGITAL SCREENING BILATERAL MAMMOGRAM WITH TOMO AND CAD

[R MLO synth-2D]
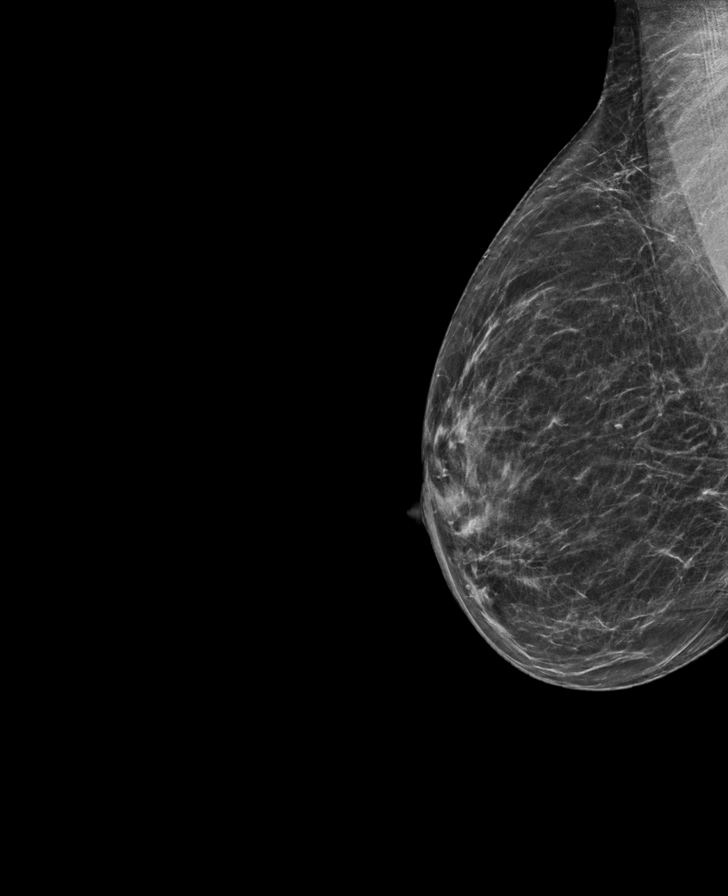

[R CC synth-2D]
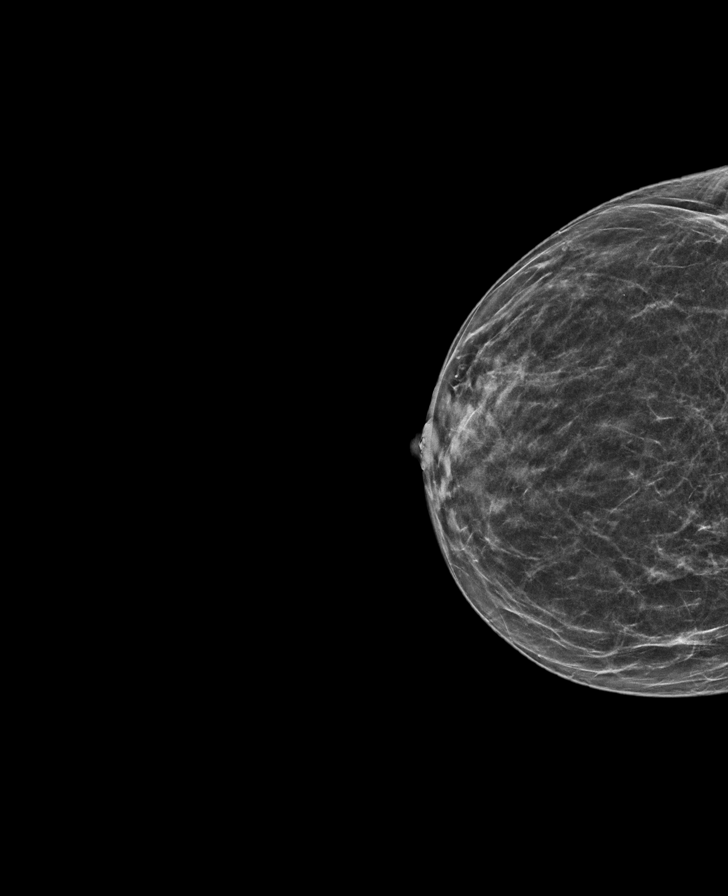

[L MLO synth-2D]
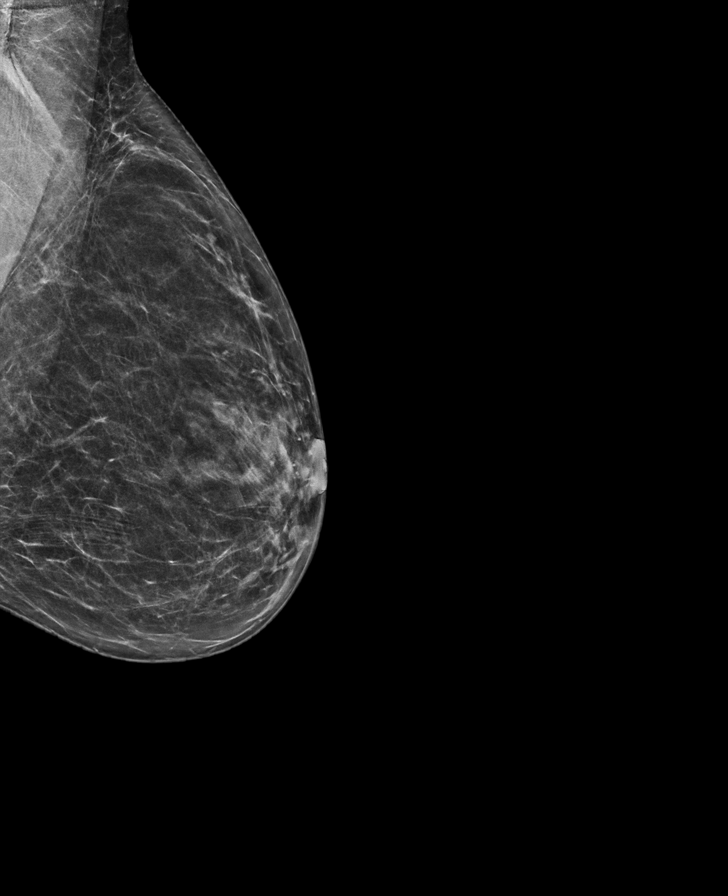

[L CC synth-2D]
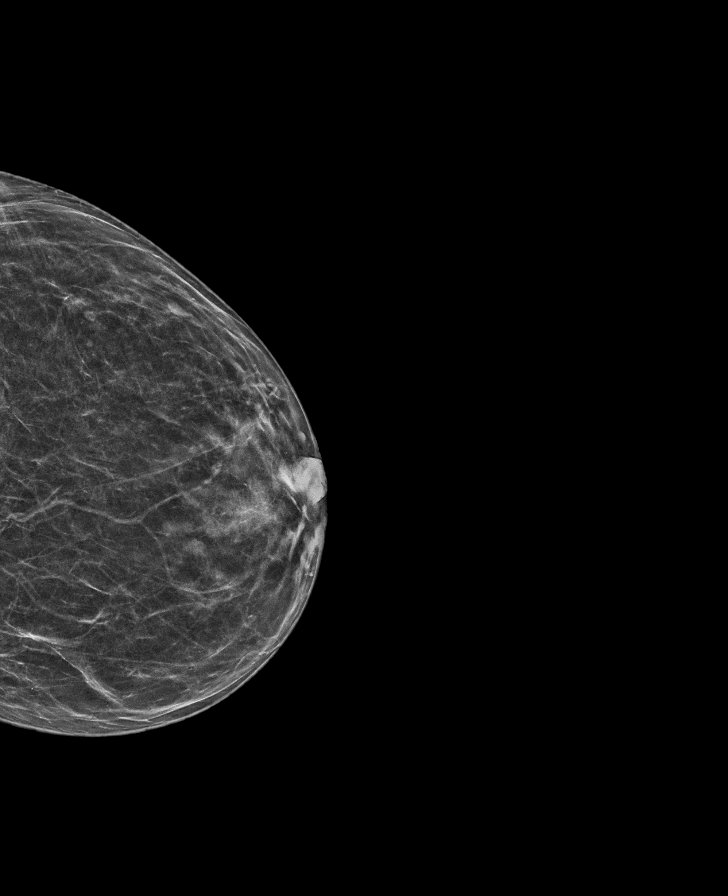

[L MLO tomo · tomo slice 31/62.0]
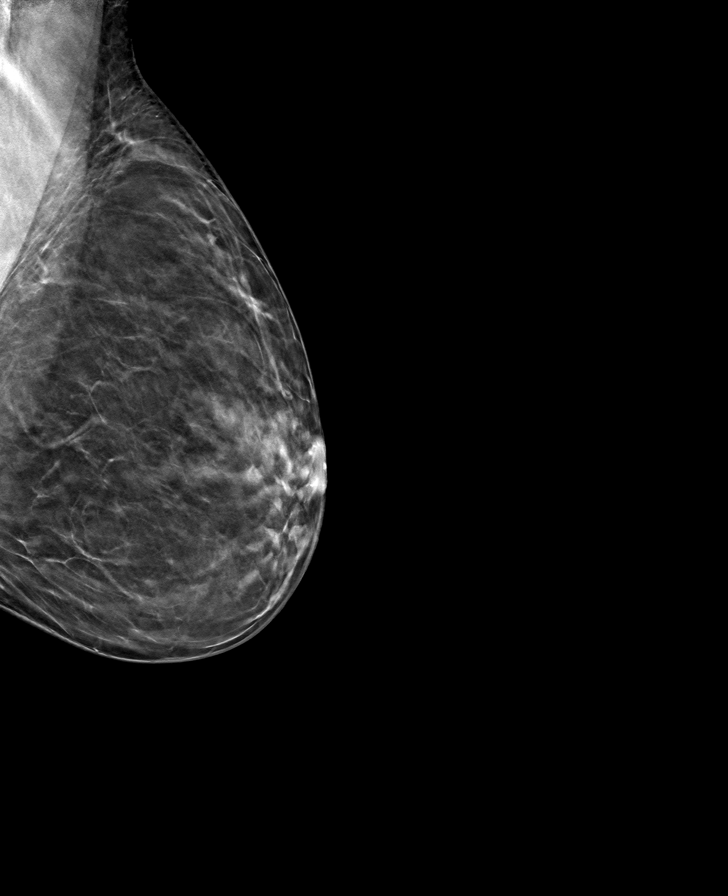

[R MLO tomo · tomo slice 31/61.0]
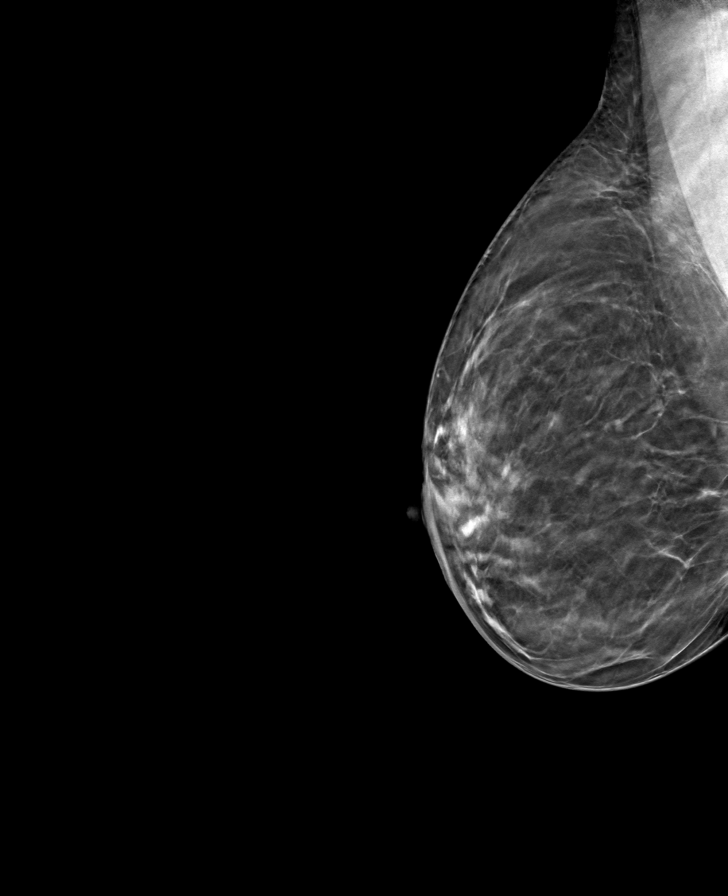

[R CC tomo · tomo slice 28/55.0]
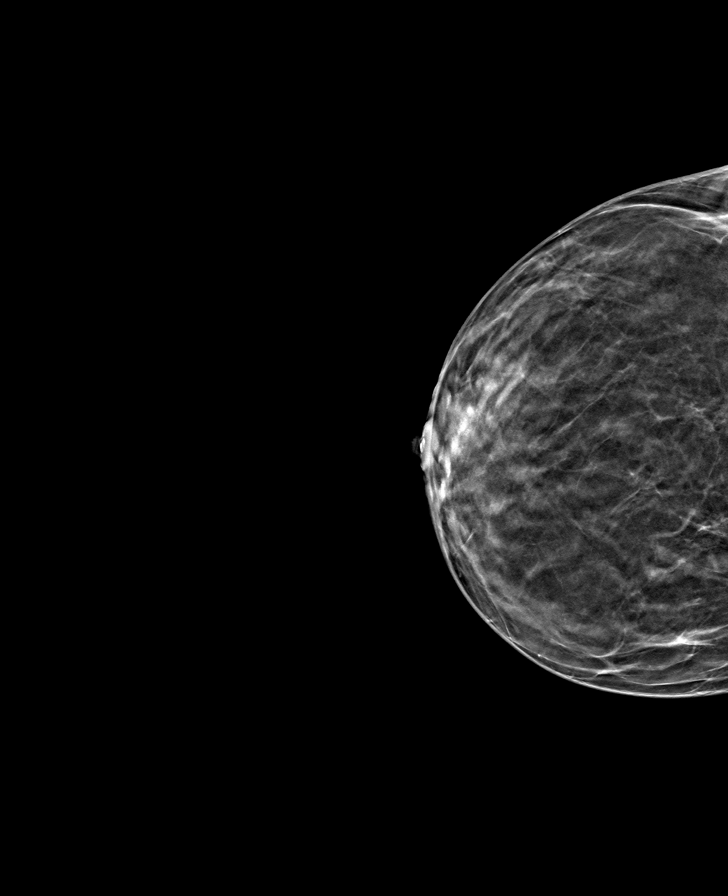

[L CC tomo · tomo slice 28/55.0]
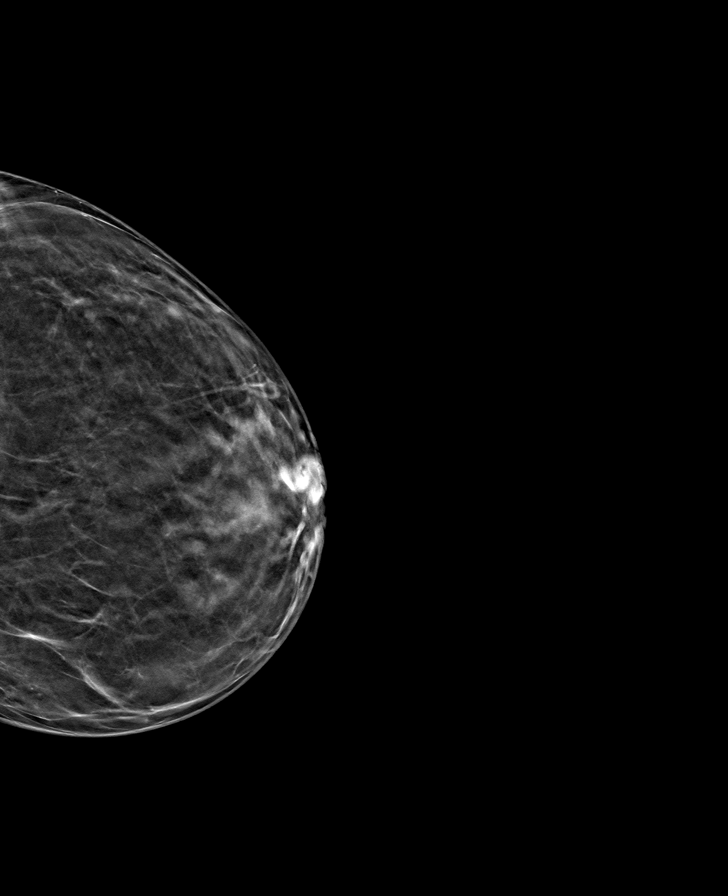

[8 of 24 positions shown; findings below may reference images not displayed]

ACR Breast Density Category b: There are scattered areas of
fibroglandular density.
FINDINGS: There are no findings suspicious for malignancy. Images were
processed with CAD.
IMPRESSION: No mammographic evidence of malignancy. A result letter of this
screening mammogram will be mailed directly to the patient.

RECOMMENDATION:
Screening mammogram in one year. (Code:[id number])

BI-RADS CATEGORY  1: Negative.

## 2021-05-15 ENCOUNTER — Other Ambulatory Visit: Payer: Self-pay | Admitting: Obstetrics and Gynecology

## 2021-05-15 ENCOUNTER — Telehealth: Payer: Self-pay

## 2021-05-15 NOTE — Telephone Encounter (Signed)
Pt calling; needs order put in for [facility] for mammogram to be done in July/early Aug.  [phone]

## 2021-05-16 ENCOUNTER — Other Ambulatory Visit: Payer: Self-pay | Admitting: Obstetrics and Gynecology

## 2021-05-16 DIAGNOSIS — Z1231 Encounter for screening mammogram for malignant neoplasm of breast: Secondary | ICD-10-CM

## 2021-05-18 NOTE — Telephone Encounter (Signed)
Pt aware.  Her annual is sched in Aug.

## 2021-06-15 ENCOUNTER — Ambulatory Visit
Admission: RE | Admit: 2021-06-15 | Discharge: 2021-06-15 | Disposition: A | Discharge status: Home / Self Care | Payer: Medicare HMO | Source: Ambulatory Visit | Attending: Obstetrics and Gynecology | Admitting: Obstetrics and Gynecology

## 2021-06-15 ENCOUNTER — Other Ambulatory Visit: Payer: Self-pay

## 2021-06-15 DIAGNOSIS — Z1231 Encounter for screening mammogram for malignant neoplasm of breast: Secondary | ICD-10-CM | POA: Diagnosis present

## 2021-06-29 ENCOUNTER — Ambulatory Visit: Payer: Medicare HMO | Admitting: Obstetrics and Gynecology

## 2021-07-28 ENCOUNTER — Encounter: Payer: Self-pay | Admitting: Obstetrics and Gynecology

## 2021-07-28 ENCOUNTER — Other Ambulatory Visit: Payer: Self-pay

## 2021-07-28 ENCOUNTER — Encounter: Payer: Medicare HMO | Admitting: Obstetrics and Gynecology

## 2021-07-28 NOTE — Progress Notes (Signed)
Error

## 2021-12-23 DIAGNOSIS — R208 Other disturbances of skin sensation: Secondary | ICD-10-CM | POA: Diagnosis not present

## 2021-12-23 DIAGNOSIS — L668 Other cicatricial alopecia: Secondary | ICD-10-CM | POA: Diagnosis not present

## 2022-03-16 DIAGNOSIS — H40153 Residual stage of open-angle glaucoma, bilateral: Secondary | ICD-10-CM | POA: Diagnosis not present

## 2022-04-12 DIAGNOSIS — I129 Hypertensive chronic kidney disease with stage 1 through stage 4 chronic kidney disease, or unspecified chronic kidney disease: Secondary | ICD-10-CM | POA: Diagnosis not present

## 2022-04-12 DIAGNOSIS — N183 Chronic kidney disease, stage 3 unspecified: Secondary | ICD-10-CM | POA: Diagnosis not present

## 2022-04-12 DIAGNOSIS — E039 Hypothyroidism, unspecified: Secondary | ICD-10-CM | POA: Diagnosis not present

## 2022-04-12 DIAGNOSIS — E7800 Pure hypercholesterolemia, unspecified: Secondary | ICD-10-CM | POA: Diagnosis not present

## 2022-04-19 DIAGNOSIS — I129 Hypertensive chronic kidney disease with stage 1 through stage 4 chronic kidney disease, or unspecified chronic kidney disease: Secondary | ICD-10-CM | POA: Diagnosis not present

## 2022-04-19 DIAGNOSIS — E7800 Pure hypercholesterolemia, unspecified: Secondary | ICD-10-CM | POA: Diagnosis not present

## 2022-04-19 DIAGNOSIS — E039 Hypothyroidism, unspecified: Secondary | ICD-10-CM | POA: Diagnosis not present

## 2022-04-19 DIAGNOSIS — N183 Chronic kidney disease, stage 3 unspecified: Secondary | ICD-10-CM | POA: Diagnosis not present

## 2022-04-22 DIAGNOSIS — R208 Other disturbances of skin sensation: Secondary | ICD-10-CM | POA: Diagnosis not present

## 2022-04-22 DIAGNOSIS — L668 Other cicatricial alopecia: Secondary | ICD-10-CM | POA: Diagnosis not present

## 2022-07-07 DIAGNOSIS — Z124 Encounter for screening for malignant neoplasm of cervix: Secondary | ICD-10-CM | POA: Diagnosis not present

## 2022-07-07 DIAGNOSIS — Z1231 Encounter for screening mammogram for malignant neoplasm of breast: Secondary | ICD-10-CM | POA: Diagnosis not present

## 2022-07-12 ENCOUNTER — Other Ambulatory Visit: Payer: Self-pay | Admitting: Obstetrics and Gynecology

## 2022-07-12 DIAGNOSIS — Z1231 Encounter for screening mammogram for malignant neoplasm of breast: Secondary | ICD-10-CM

## 2022-08-05 ENCOUNTER — Ambulatory Visit
Admission: RE | Admit: 2022-08-05 | Discharge: 2022-08-05 | Disposition: A | Discharge status: Home / Self Care | Payer: PPO | Source: Ambulatory Visit | Attending: Obstetrics and Gynecology | Admitting: Obstetrics and Gynecology

## 2022-08-05 DIAGNOSIS — Z1231 Encounter for screening mammogram for malignant neoplasm of breast: Secondary | ICD-10-CM | POA: Insufficient documentation

## 2022-09-21 DIAGNOSIS — H40153 Residual stage of open-angle glaucoma, bilateral: Secondary | ICD-10-CM | POA: Diagnosis not present

## 2022-10-18 DIAGNOSIS — E039 Hypothyroidism, unspecified: Secondary | ICD-10-CM | POA: Diagnosis not present

## 2022-10-18 DIAGNOSIS — I129 Hypertensive chronic kidney disease with stage 1 through stage 4 chronic kidney disease, or unspecified chronic kidney disease: Secondary | ICD-10-CM | POA: Diagnosis not present

## 2022-10-18 DIAGNOSIS — N183 Chronic kidney disease, stage 3 unspecified: Secondary | ICD-10-CM | POA: Diagnosis not present

## 2022-10-18 DIAGNOSIS — E7800 Pure hypercholesterolemia, unspecified: Secondary | ICD-10-CM | POA: Diagnosis not present

## 2022-10-25 DIAGNOSIS — E7800 Pure hypercholesterolemia, unspecified: Secondary | ICD-10-CM | POA: Diagnosis not present

## 2022-10-25 DIAGNOSIS — E039 Hypothyroidism, unspecified: Secondary | ICD-10-CM | POA: Diagnosis not present

## 2022-10-25 DIAGNOSIS — Z0000 Encounter for general adult medical examination without abnormal findings: Secondary | ICD-10-CM | POA: Diagnosis not present

## 2022-10-25 DIAGNOSIS — I129 Hypertensive chronic kidney disease with stage 1 through stage 4 chronic kidney disease, or unspecified chronic kidney disease: Secondary | ICD-10-CM | POA: Diagnosis not present

## 2022-10-25 DIAGNOSIS — N183 Chronic kidney disease, stage 3 unspecified: Secondary | ICD-10-CM | POA: Diagnosis not present

## 2022-10-26 DIAGNOSIS — R208 Other disturbances of skin sensation: Secondary | ICD-10-CM | POA: Diagnosis not present

## 2022-10-26 DIAGNOSIS — L853 Xerosis cutis: Secondary | ICD-10-CM | POA: Diagnosis not present

## 2022-10-26 DIAGNOSIS — L668 Other cicatricial alopecia: Secondary | ICD-10-CM | POA: Diagnosis not present

## 2022-11-19 DIAGNOSIS — J301 Allergic rhinitis due to pollen: Secondary | ICD-10-CM | POA: Diagnosis not present

## 2022-11-19 DIAGNOSIS — H9041 Sensorineural hearing loss, unilateral, right ear, with unrestricted hearing on the contralateral side: Secondary | ICD-10-CM | POA: Diagnosis not present

## 2023-03-15 DIAGNOSIS — L668 Other cicatricial alopecia: Secondary | ICD-10-CM | POA: Diagnosis not present

## 2023-03-15 DIAGNOSIS — L298 Other pruritus: Secondary | ICD-10-CM | POA: Diagnosis not present

## 2023-03-24 DIAGNOSIS — H40153 Residual stage of open-angle glaucoma, bilateral: Secondary | ICD-10-CM | POA: Diagnosis not present

## 2023-07-22 ENCOUNTER — Other Ambulatory Visit: Payer: Self-pay | Admitting: Internal Medicine

## 2023-07-22 DIAGNOSIS — Z1231 Encounter for screening mammogram for malignant neoplasm of breast: Secondary | ICD-10-CM

## 2023-07-25 DIAGNOSIS — H40153 Residual stage of open-angle glaucoma, bilateral: Secondary | ICD-10-CM | POA: Diagnosis not present

## 2023-08-08 ENCOUNTER — Ambulatory Visit
Admission: RE | Admit: 2023-08-08 | Discharge: 2023-08-08 | Disposition: A | Discharge status: Home / Self Care | Payer: PPO | Source: Ambulatory Visit | Attending: Internal Medicine | Admitting: Internal Medicine

## 2023-08-08 DIAGNOSIS — Z1231 Encounter for screening mammogram for malignant neoplasm of breast: Secondary | ICD-10-CM | POA: Diagnosis not present

## 2023-10-21 DIAGNOSIS — L6681 Central centrifugal cicatricial alopecia: Secondary | ICD-10-CM | POA: Diagnosis not present

## 2023-10-24 DIAGNOSIS — E039 Hypothyroidism, unspecified: Secondary | ICD-10-CM | POA: Diagnosis not present

## 2023-10-24 DIAGNOSIS — I129 Hypertensive chronic kidney disease with stage 1 through stage 4 chronic kidney disease, or unspecified chronic kidney disease: Secondary | ICD-10-CM | POA: Diagnosis not present

## 2023-10-24 DIAGNOSIS — E7800 Pure hypercholesterolemia, unspecified: Secondary | ICD-10-CM | POA: Diagnosis not present

## 2023-10-24 DIAGNOSIS — N183 Chronic kidney disease, stage 3 unspecified: Secondary | ICD-10-CM | POA: Diagnosis not present

## 2023-10-31 DIAGNOSIS — N183 Chronic kidney disease, stage 3 unspecified: Secondary | ICD-10-CM | POA: Diagnosis not present

## 2023-10-31 DIAGNOSIS — Z2821 Immunization not carried out because of patient refusal: Secondary | ICD-10-CM | POA: Diagnosis not present

## 2023-10-31 DIAGNOSIS — I129 Hypertensive chronic kidney disease with stage 1 through stage 4 chronic kidney disease, or unspecified chronic kidney disease: Secondary | ICD-10-CM | POA: Diagnosis not present

## 2023-10-31 DIAGNOSIS — Z0000 Encounter for general adult medical examination without abnormal findings: Secondary | ICD-10-CM | POA: Diagnosis not present

## 2023-10-31 DIAGNOSIS — E039 Hypothyroidism, unspecified: Secondary | ICD-10-CM | POA: Diagnosis not present

## 2023-10-31 DIAGNOSIS — E7800 Pure hypercholesterolemia, unspecified: Secondary | ICD-10-CM | POA: Diagnosis not present

## 2023-11-07 DIAGNOSIS — H40153 Residual stage of open-angle glaucoma, bilateral: Secondary | ICD-10-CM | POA: Diagnosis not present

## 2023-11-21 DIAGNOSIS — H9041 Sensorineural hearing loss, unilateral, right ear, with unrestricted hearing on the contralateral side: Secondary | ICD-10-CM | POA: Diagnosis not present

## 2023-11-21 DIAGNOSIS — J301 Allergic rhinitis due to pollen: Secondary | ICD-10-CM | POA: Diagnosis not present

## 2024-03-08 DIAGNOSIS — H40153 Residual stage of open-angle glaucoma, bilateral: Secondary | ICD-10-CM | POA: Diagnosis not present

## 2024-04-18 DIAGNOSIS — L6681 Central centrifugal cicatricial alopecia: Secondary | ICD-10-CM | POA: Diagnosis not present

## 2024-04-24 DIAGNOSIS — E7800 Pure hypercholesterolemia, unspecified: Secondary | ICD-10-CM | POA: Diagnosis not present

## 2024-04-24 DIAGNOSIS — I129 Hypertensive chronic kidney disease with stage 1 through stage 4 chronic kidney disease, or unspecified chronic kidney disease: Secondary | ICD-10-CM | POA: Diagnosis not present

## 2024-04-24 DIAGNOSIS — N183 Chronic kidney disease, stage 3 unspecified: Secondary | ICD-10-CM | POA: Diagnosis not present

## 2024-04-24 DIAGNOSIS — E039 Hypothyroidism, unspecified: Secondary | ICD-10-CM | POA: Diagnosis not present

## 2024-04-30 DIAGNOSIS — I129 Hypertensive chronic kidney disease with stage 1 through stage 4 chronic kidney disease, or unspecified chronic kidney disease: Secondary | ICD-10-CM | POA: Diagnosis not present

## 2024-04-30 DIAGNOSIS — E039 Hypothyroidism, unspecified: Secondary | ICD-10-CM | POA: Diagnosis not present

## 2024-04-30 DIAGNOSIS — R799 Abnormal finding of blood chemistry, unspecified: Secondary | ICD-10-CM | POA: Diagnosis not present

## 2024-04-30 DIAGNOSIS — E7800 Pure hypercholesterolemia, unspecified: Secondary | ICD-10-CM | POA: Diagnosis not present

## 2024-04-30 DIAGNOSIS — N183 Chronic kidney disease, stage 3 unspecified: Secondary | ICD-10-CM | POA: Diagnosis not present

## 2024-06-13 DIAGNOSIS — H40153 Residual stage of open-angle glaucoma, bilateral: Secondary | ICD-10-CM | POA: Diagnosis not present

## 2024-07-27 ENCOUNTER — Other Ambulatory Visit: Payer: Self-pay | Admitting: Internal Medicine

## 2024-07-27 DIAGNOSIS — Z1231 Encounter for screening mammogram for malignant neoplasm of breast: Secondary | ICD-10-CM

## 2024-08-22 ENCOUNTER — Ambulatory Visit
Admission: RE | Admit: 2024-08-22 | Discharge: 2024-08-22 | Disposition: A | Discharge status: Home / Self Care | Source: Ambulatory Visit | Attending: Internal Medicine | Admitting: Internal Medicine

## 2024-08-22 DIAGNOSIS — Z1231 Encounter for screening mammogram for malignant neoplasm of breast: Secondary | ICD-10-CM | POA: Diagnosis not present

## 2024-10-08 DIAGNOSIS — H40153 Residual stage of open-angle glaucoma, bilateral: Secondary | ICD-10-CM | POA: Diagnosis not present

## 2024-10-22 DIAGNOSIS — B372 Candidiasis of skin and nail: Secondary | ICD-10-CM | POA: Diagnosis not present

## 2024-10-22 DIAGNOSIS — L6681 Central centrifugal cicatricial alopecia: Secondary | ICD-10-CM | POA: Diagnosis not present

## 2024-10-31 DIAGNOSIS — I129 Hypertensive chronic kidney disease with stage 1 through stage 4 chronic kidney disease, or unspecified chronic kidney disease: Secondary | ICD-10-CM | POA: Diagnosis not present

## 2024-10-31 DIAGNOSIS — N183 Chronic kidney disease, stage 3 unspecified: Secondary | ICD-10-CM | POA: Diagnosis not present

## 2024-10-31 DIAGNOSIS — E7800 Pure hypercholesterolemia, unspecified: Secondary | ICD-10-CM | POA: Diagnosis not present

## 2024-10-31 DIAGNOSIS — R799 Abnormal finding of blood chemistry, unspecified: Secondary | ICD-10-CM | POA: Diagnosis not present

## 2024-10-31 DIAGNOSIS — E039 Hypothyroidism, unspecified: Secondary | ICD-10-CM | POA: Diagnosis not present
